# Patient Record
Sex: Female | Born: 1990 | Race: White | Hispanic: No | Marital: Single | Smoking: Never smoker
Health system: Southern US, Community
[De-identification: ages and names within clinical notes are randomized; demographics above are authoritative.]

---
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
## 2012-09-16 ENCOUNTER — Ambulatory Visit: Payer: Self-pay | Admitting: Obstetrics and Gynecology

## 2012-09-16 LAB — CBC
HCT: 35.8 % (ref 35.0–47.0)
MCH: 28.5 pg (ref 26.0–34.0)
MCV: 87 fL (ref 80–100)
Platelet: 168 10*3/uL (ref 150–440)
RBC: 4.14 10*6/uL (ref 3.80–5.20)
RDW: 13.8 % (ref 11.5–14.5)

## 2012-09-19 ENCOUNTER — Inpatient Hospital Stay: Payer: Self-pay | Admitting: Obstetrics and Gynecology

## 2014-11-12 DIAGNOSIS — H6121 Impacted cerumen, right ear: Secondary | ICD-10-CM | POA: Insufficient documentation

## 2015-04-09 NOTE — H&P (Signed)
PATIENT NAME:  Denice BorsBATTS, Chenell MR#:  [id number] DATE OF BIRTH:  26-Sep-1991  DATE OF ADMISSION:  09/19/2012  PREOPERATIVE DIAGNOSES:  1. 39.0 week intrauterine pregnancy, undelivered.  2. Severe anxiety and vaginismus.   HISTORY: The patient is a 24 year old year-old single white female gravida 1, para 0 with an EDC of 09/22/2012, EGA 39.4 weeks, who presents for primary low cervical transverse cesarean section secondary to term pregnancy complicated by severe anxiety and vaginismus. The patient opts for elective primary C-section. Her prenatal course has been uneventful except for nausea and vomiting in the first trimester as well as transient first trimester spotting which has resolved.   The patient reports good fetal movement. She denies preeclampsia symptoms.   PHYSICAL EXAMINATION:   VITAL SIGNS: Height 5 feet, weight 141, blood pressure 103/73, and heart rate 78.   GENERAL: The patient is a pleasant white female with significant anxiety. She is quiet in demeanor.   HEENT: Oropharynx is clear.   NECK: Supple without thyromegaly or adenopathy.   LUNGS: Clear.   HEART: Regular rate and rhythm without murmur.   ABDOMEN: Soft, gravid. Fundal height 33 cm. Estimated fetal weight 7 pounds. Fetal heart tones are normal.   PELVIC: Deferred due to patient's severe anxiety and vaginismus and noncompliant.   EXTREMITIES: Trace edema.   SKIN: No rash.   MUSCULOSKELETAL: Normal.   PRENATAL LABS: A-positive blood. Rh type antibody screen negative. RPR nonreactive. Rubella immune. Varicella immune. HBsAg negative. HIV negative. Cystic fibrosis negative. Toxo titer negative. GBS not done due to the patient's inability to tolerate exam. Pap notable for LGSIL.   IMPRESSION:  1. 39.4 week intrauterine pregnancy, undelivered.  2. Severe anxiety/vaginismus.   PLAN: Primary low cervical transverse cesarean section.   CONSENT NOTE: The patient is to undergo primary C-section. She is  understanding of the planned procedure and is aware and accepting of all surgical risks which include but are not limited to bleeding, infection, pelvic organ injury with need for repair, fetal injury, blood clot disorders, anesthesia risks, and death. All questions are answered. Informed consent is given. The patient is ready and willing to proceed with surgery as scheduled. ____________________________ Prentice DockerMartin A. Bellamarie Pflug, MD mad:slb D: 09/19/2012 16:01:00 ET T: 09/19/2012 16:21:02 ET JOB#: [id number]  cc: Daphine DeutscherMartin A. Alois Mincer, MD, <Dictator> Prentice DockerMARTIN A Joycelin Radloff MD ELECTRONICALLY SIGNED 09/22/2012 20:38

## 2015-04-09 NOTE — Op Note (Signed)
PATIENT NAME:  Taylor Wilson, Taylor Wilson MR#:  [id number] DATE OF BIRTH:  Jun 19, 1991  DATE OF PROCEDURE:  09/19/2012  PREOPERATIVE DIAGNOSES:  1. A 39.4 week intrauterine pregnancy, undelivered.  2. Extreme anxiety/vaginismus.   POSTOPERATIVE DIAGNOSES:  1. A 39.4 week intrauterine pregnancy, undelivered.  2. Extreme anxiety/vaginismus.  3. Viable female, 6 pounds and 14 ounces.   OPERATIVE PROCEDURE: Primary low segment transverse cesarean section.   SURGEON: Herold HarmsMartin A Jupiter Boys, Taylor Wilson   FIRST ASSISTANT: Yolanda BonineMelody Burr, CNM   ANESTHESIA: Spinal.   INDICATIONS: The patient is a 24 year old white female, gravida 1, para zero at 39.[redacted] weeks gestation, who presents for primary low cervical transverse cesarean section secondary to a personal history of extreme anxiety and vaginismus. The patient is not capable of tolerating labor. The prenatal course has been otherwise uneventful except for some early first trimester spotting and nausea and vomiting which have resolved.   FINDINGS AT SURGERY: A viable female infant, 6 pounds and 14 ounces, having Apgars of 2 and 9 at one minute and five minutes, respectively. Uterus, tubes, and ovaries were grossly normal. The placenta was grossly normal.   DESCRIPTION OF PROCEDURE: The patient was brought to the Operating Room where she was placed in sitting position. Spinal anesthetic was introduced without difficulty. She was placed in the supine position with a right lateral hip roll in place. A Foley catheter was placed and was draining clear yellow urine. A ChloraPrep abdominal and perineal prep and drape was performed in the standard fashion. After checking for adequate level of anesthesia, a Pfannenstiel incision was made in the abdomen. The fascia was incised transversely and extended bilaterally with Mayo's. The midline raphe was incised, separated. and the peritoneum was entered. A bladder flap was created through sharp dissection. A low transverse incision was made in  the uterus and this was extended bluntly bilaterally. The infant was delivered through a vertex presentation and the oropharynx and nasopharynx were cleared of debris. The nuchal cord was doubly clamped and cut, and the infant was passed to the awaiting resuscitation team. Cord blood typing was obtained. Cord gases were sent. The placenta was expressed from the uterus. The uterus was externalized onto  the anterior abdominal wall and cleared of all debris . The incision was closed in layers with #1 chromic suture. The first layer was a running locking stitch. The second layer was a imbricating layer. The uterus was placed back into the abdominopelvic cavity. Gutters were cleared of all debris with laps. The incision was then closed in layers with 0 Maxon being used in the fascia in a simple running manner. The skin was closed with staples. Pressure dressing was applied. The patient was then mobilized and taken to the recovery room in satisfactory condition.   ESTIMATED BLOOD LOSS: 500 mL.   COMPLICATIONS: None.   All instrument, needle, and sponge counts were verified as correct. The patient did receive clindamycin antibiotic prophylaxis.  ____________________________ Taylor DockerMartin A. Pink Maye, Taylor Wilson mad:cbb D: 09/19/2012 20:42:00 ET T: 09/20/2012 09:48:22 ET JOB#: [id number]  cc: Taylor DeutscherMartin A. Taygan Connell, Taylor Wilson, <Dictator> Taylor Wilson ELECTRONICALLY SIGNED 09/22/2012 21:50

## 2015-06-17 ENCOUNTER — Emergency Department: Payer: BLUE CROSS/BLUE SHIELD

## 2015-06-17 ENCOUNTER — Encounter: Payer: Self-pay | Admitting: *Deleted

## 2015-06-17 ENCOUNTER — Emergency Department
Admission: EM | Admit: 2015-06-17 | Discharge: 2015-06-17 | Disposition: A | Discharge status: Home / Self Care | Payer: BLUE CROSS/BLUE SHIELD | Attending: Emergency Medicine | Admitting: Emergency Medicine

## 2015-06-17 DIAGNOSIS — S0081XA Abrasion of other part of head, initial encounter: Secondary | ICD-10-CM | POA: Diagnosis not present

## 2015-06-17 DIAGNOSIS — Z880 Allergy status to penicillin: Secondary | ICD-10-CM | POA: Insufficient documentation

## 2015-06-17 DIAGNOSIS — S139XXA Sprain of joints and ligaments of unspecified parts of neck, initial encounter: Secondary | ICD-10-CM | POA: Diagnosis not present

## 2015-06-17 DIAGNOSIS — S40219A Abrasion of unspecified shoulder, initial encounter: Secondary | ICD-10-CM | POA: Diagnosis not present

## 2015-06-17 DIAGNOSIS — S0990XA Unspecified injury of head, initial encounter: Secondary | ICD-10-CM | POA: Diagnosis present

## 2015-06-17 DIAGNOSIS — Y998 Other external cause status: Secondary | ICD-10-CM | POA: Insufficient documentation

## 2015-06-17 DIAGNOSIS — Y92410 Unspecified street and highway as the place of occurrence of the external cause: Secondary | ICD-10-CM | POA: Diagnosis not present

## 2015-06-17 DIAGNOSIS — Y9389 Activity, other specified: Secondary | ICD-10-CM | POA: Insufficient documentation

## 2015-06-17 DIAGNOSIS — Z79899 Other long term (current) drug therapy: Secondary | ICD-10-CM | POA: Insufficient documentation

## 2015-06-17 DIAGNOSIS — T148XXA Other injury of unspecified body region, initial encounter: Secondary | ICD-10-CM

## 2015-06-17 LAB — CBC WITH DIFFERENTIAL/PLATELET
BASOS PCT: 0 %
Basophils Absolute: 0 10*3/uL (ref 0–0.1)
EOS PCT: 0 %
Eosinophils Absolute: 0 10*3/uL (ref 0–0.7)
HEMATOCRIT: 42.4 % (ref 35.0–47.0)
HEMOGLOBIN: 14 g/dL (ref 12.0–16.0)
Lymphocytes Relative: 8 %
Lymphs Abs: 1.4 10*3/uL (ref 1.0–3.6)
MCH: 29.6 pg (ref 26.0–34.0)
MCHC: 33 g/dL (ref 32.0–36.0)
MCV: 89.8 fL (ref 80.0–100.0)
MONO ABS: 0.8 10*3/uL (ref 0.2–0.9)
MONOS PCT: 5 %
NEUTROS ABS: 14.6 10*3/uL — AB (ref 1.4–6.5)
Neutrophils Relative %: 87 %
Platelets: 265 10*3/uL (ref 150–440)
RBC: 4.72 MIL/uL (ref 3.80–5.20)
RDW: 13.3 % (ref 11.5–14.5)
WBC: 16.8 10*3/uL — ABNORMAL HIGH (ref 3.6–11.0)

## 2015-06-17 LAB — COMPREHENSIVE METABOLIC PANEL
ALBUMIN: 4.6 g/dL (ref 3.5–5.0)
ALK PHOS: 32 U/L — AB (ref 38–126)
ALT: 18 U/L (ref 14–54)
ANION GAP: 10 (ref 5–15)
AST: 24 U/L (ref 15–41)
BUN: 15 mg/dL (ref 6–20)
CO2: 25 mmol/L (ref 22–32)
Calcium: 8.9 mg/dL (ref 8.9–10.3)
Chloride: 110 mmol/L (ref 101–111)
Creatinine, Ser: 0.81 mg/dL (ref 0.44–1.00)
GFR calc Af Amer: >60 mL/min (ref >60)
GFR calc non Af Amer: >60 mL/min (ref >60)
Glucose, Bld: 104 mg/dL — ABNORMAL HIGH (ref 65–99)
Potassium: 3.7 mmol/L (ref 3.5–5.1)
SODIUM: 145 mmol/L (ref 135–145)
TOTAL PROTEIN: 8 g/dL (ref 6.5–8.1)
Total Bilirubin: 0.3 mg/dL (ref 0.3–1.2)

## 2015-06-17 LAB — LIPASE, BLOOD: Lipase: 30 U/L (ref 22–51)

## 2015-06-17 MED ORDER — ONDANSETRON HCL 4 MG/2ML IJ SOLN
4.0000 mg | Freq: Once | INTRAMUSCULAR | Status: AC
  Administered 2015-06-17: 4 mg via INTRAVENOUS

## 2015-06-17 MED ORDER — MORPHINE SULFATE 2 MG/ML IJ SOLN
INTRAMUSCULAR | Status: AC
  Filled 2015-06-17: qty 1

## 2015-06-17 MED ORDER — DIPHENHYDRAMINE HCL 50 MG/ML IJ SOLN
12.5000 mg | Freq: Once | INTRAMUSCULAR | Status: AC
  Administered 2015-06-17: 12.5 mg via INTRAVENOUS

## 2015-06-17 MED ORDER — DIPHENHYDRAMINE HCL 50 MG/ML IJ SOLN
INTRAMUSCULAR | Status: AC
  Filled 2015-06-17: qty 1

## 2015-06-17 MED ORDER — IBUPROFEN 600 MG PO TABS: 600.0000 mg | ORAL_TABLET | Freq: Three times a day (TID) | ORAL | Status: DC | PRN

## 2015-06-17 MED ORDER — MORPHINE SULFATE 2 MG/ML IJ SOLN
2.0000 mg | Freq: Once | INTRAMUSCULAR | Status: AC
  Administered 2015-06-17: 2 mg via INTRAVENOUS

## 2015-06-17 MED ORDER — HYDROCODONE-ACETAMINOPHEN 5-325 MG PO TABS: 1.0000 | ORAL_TABLET | Freq: Four times a day (QID) | ORAL | Status: DC | PRN
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
## 2015-06-17 NOTE — ED Notes (Signed)
Pt declines offer for morphine and zofran.
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
## 2015-06-17 NOTE — ED Notes (Signed)
Pt now requesting morphine and zofran.
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
## 2017-08-25 ENCOUNTER — Encounter: Payer: Self-pay | Admitting: Obstetrics and Gynecology

## 2017-10-05 ENCOUNTER — Ambulatory Visit: Payer: Self-pay | Admitting: Obstetrics and Gynecology

## 2018-03-23 ENCOUNTER — Ambulatory Visit: Payer: BLUE CROSS/BLUE SHIELD | Admitting: Family Medicine

## 2018-03-23 DIAGNOSIS — Z0289 Encounter for other administrative examinations: Secondary | ICD-10-CM

## 2019-10-31 ENCOUNTER — Other Ambulatory Visit: Payer: Self-pay

## 2019-11-01 ENCOUNTER — Ambulatory Visit: Payer: BLUE CROSS/BLUE SHIELD | Admitting: Urology

## 2019-11-01 ENCOUNTER — Encounter: Payer: Self-pay | Admitting: Urology

## 2019-11-01 NOTE — Progress Notes (Deleted)
   11/01/2019 9:32 AM   Taylor Wilson [birth year] [id number]  Referring provider: Mariana Arn, MD [address] [place],  [place] [address]  No chief complaint on file.   HPI:    PMH: No past medical history on file.  Surgical History: Past Surgical History:  Procedure Laterality Date  . CESAREAN SECTION      Home Medications:  Allergies as of 11/01/2019      Reactions   Penicillins       Medication List       Accurate as of November 01, 2019  9:32 AM. If you have any questions, ask your nurse or doctor.        HYDROcodone-acetaminophen 5-325 MG tablet Commonly known as: Norco Take 1 tablet by mouth every 6 (six) hours as needed for moderate pain.   ibuprofen 600 MG tablet Commonly known as: ADVIL Take 1 tablet (600 mg total) by mouth every 8 (eight) hours as needed.   ISOtretinoin 40 MG capsule Commonly known as: ACCUTANE Take 40 mg by mouth 3 (three) times daily.       Allergies:  Allergies  Allergen Reactions  . Penicillins     Family History: No family history on file.  Social History:  reports that she has never smoked. She has never used smokeless tobacco. She reports current alcohol use. She reports that she does not use drugs.  ROS:                                        Physical Exam: There were no vitals taken for this visit.  Constitutional:  Alert and oriented, No acute distress. HEENT: [place] AT, moist mucus membranes.  Trachea midline, no masses. Cardiovascular: No clubbing, cyanosis, or edema. Respiratory: Normal respiratory effort, no increased work of breathing. GI: Abdomen is soft, nontender, nondistended, no abdominal masses GU: No CVA tenderness Lymph: No cervical or inguinal lymphadenopathy. Skin: No rashes, bruises or suspicious lesions. Neurologic: Grossly intact, no focal deficits, moving all 4 extremities. Psychiatric: Normal mood and affect.  Laboratory Data: Lab Results  Component Value  Date   WBC 16.8 (H) 06/17/2015   HGB 14.0 06/17/2015   HCT 42.4 06/17/2015   MCV 89.8 06/17/2015   PLT 265 06/17/2015    Lab Results  Component Value Date   CREATININE 0.81 06/17/2015    No results found for: PSA  No results found for: TESTOSTERONE  No results found for: HGBA1C  Urinalysis No results found for: COLORURINE, APPEARANCEUR, LABSPEC, PHURINE, GLUCOSEU, HGBUR, BILIRUBINUR, KETONESUR, PROTEINUR, UROBILINOGEN, NITRITE, LEUKOCYTESUR  No results found for: LABMICR, [place], RBCUA, LABEPIT, MUCUS, BACTERIA  Pertinent Imaging: *** No results found for this or any previous visit. No results found for this or any previous visit. No results found for this or any previous visit. No results found for this or any previous visit. No results found for this or any previous visit. No results found for this or any previous visit. No results found for this or any previous visit. No results found for this or any previous visit.  Assessment & Plan:    There are no diagnoses linked to this encounter.  No follow-ups on file.  Hollice Espy, MD  [facility] [address] [place], [place] [address] [phone]

## 2020-04-18 DIAGNOSIS — R768 Other specified abnormal immunological findings in serum: Secondary | ICD-10-CM | POA: Insufficient documentation

## 2020-04-25 DIAGNOSIS — J4520 Mild intermittent asthma, uncomplicated: Secondary | ICD-10-CM | POA: Insufficient documentation

## 2020-04-25 DIAGNOSIS — K219 Gastro-esophageal reflux disease without esophagitis: Secondary | ICD-10-CM | POA: Insufficient documentation

## 2020-04-25 DIAGNOSIS — F330 Major depressive disorder, recurrent, mild: Secondary | ICD-10-CM | POA: Insufficient documentation

## 2020-08-22 ENCOUNTER — Other Ambulatory Visit: Payer: Self-pay | Admitting: Internal Medicine

## 2020-08-22 DIAGNOSIS — R109 Unspecified abdominal pain: Secondary | ICD-10-CM

## 2020-08-22 DIAGNOSIS — R309 Painful micturition, unspecified: Secondary | ICD-10-CM

## 2020-09-27 ENCOUNTER — Other Ambulatory Visit: Payer: Self-pay

## 2020-09-27 ENCOUNTER — Emergency Department: Payer: BLUE CROSS/BLUE SHIELD

## 2020-09-27 DIAGNOSIS — Z5321 Procedure and treatment not carried out due to patient leaving prior to being seen by health care provider: Secondary | ICD-10-CM | POA: Insufficient documentation

## 2020-09-27 DIAGNOSIS — R0602 Shortness of breath: Secondary | ICD-10-CM | POA: Insufficient documentation

## 2020-09-27 DIAGNOSIS — R0600 Dyspnea, unspecified: Secondary | ICD-10-CM | POA: Diagnosis not present

## 2020-09-27 DIAGNOSIS — R079 Chest pain, unspecified: Secondary | ICD-10-CM | POA: Insufficient documentation

## 2020-09-27 DIAGNOSIS — R002 Palpitations: Secondary | ICD-10-CM | POA: Insufficient documentation

## 2020-09-27 LAB — BASIC METABOLIC PANEL
Anion gap: 10 (ref 5–15)
BUN: 14 mg/dL (ref 6–20)
CO2: 23 mmol/L (ref 22–32)
Calcium: 8.9 mg/dL (ref 8.9–10.3)
Chloride: 105 mmol/L (ref 98–111)
Creatinine, Ser: 0.8 mg/dL (ref 0.44–1.00)
GFR, Estimated: >60 mL/min (ref >60)
Glucose, Bld: 98 mg/dL (ref 70–99)
Potassium: 3.1 mmol/L — ABNORMAL LOW (ref 3.5–5.1)
Sodium: 138 mmol/L (ref 135–145)

## 2020-09-27 LAB — CBC
HCT: 38.9 % (ref 36.0–46.0)
Hemoglobin: 13.6 g/dL (ref 12.0–15.0)
MCH: 29.2 pg (ref 26.0–34.0)
MCHC: 35 g/dL (ref 30.0–36.0)
MCV: 83.5 fL (ref 80.0–100.0)
Platelets: 227 10*3/uL (ref 150–400)
RBC: 4.66 MIL/uL (ref 3.87–5.11)
RDW: 12.6 % (ref 11.5–15.5)
WBC: 12 10*3/uL — ABNORMAL HIGH (ref 4.0–10.5)
nRBC: 0 % (ref 0.0–0.2)

## 2020-09-27 LAB — TROPONIN I (HIGH SENSITIVITY): Troponin I (High Sensitivity): <2 ng/L (ref <18)

## 2020-09-27 NOTE — ED Triage Notes (Signed)
Pt states palpitations and chest tightness that began an hour ago. Pt with states she also feels like it is difficulty to take a deep breath. Pt appears in no acute distress.

## 2020-09-28 ENCOUNTER — Emergency Department
Admission: EM | Admit: 2020-09-28 | Discharge: 2020-09-28 | Disposition: A | Discharge status: Home / Self Care | Payer: BLUE CROSS/BLUE SHIELD | Attending: Emergency Medicine | Admitting: Emergency Medicine

## 2020-09-28 LAB — URINALYSIS, COMPLETE (UACMP) WITH MICROSCOPIC
Bacteria, UA: NONE SEEN
Bilirubin Urine: NEGATIVE
Glucose, UA: NEGATIVE mg/dL
Hgb urine dipstick: NEGATIVE
Ketones, ur: NEGATIVE mg/dL
Leukocytes,Ua: NEGATIVE
Nitrite: NEGATIVE
Protein, ur: NEGATIVE mg/dL
Specific Gravity, Urine: 1.021 (ref 1.005–1.030)
pH: 6 (ref 5.0–8.0)

## 2020-09-28 LAB — T4, FREE: Free T4: 0.95 ng/dL (ref 0.61–1.12)

## 2020-09-28 LAB — TROPONIN I (HIGH SENSITIVITY): Troponin I (High Sensitivity): 4 ng/L (ref <18)

## 2020-09-28 LAB — URINE DRUG SCREEN, QUALITATIVE (ARMC ONLY)
Amphetamines, Ur Screen: POSITIVE — AB
Barbiturates, Ur Screen: NOT DETECTED
Benzodiazepine, Ur Scrn: NOT DETECTED
Cannabinoid 50 Ng, Ur ~~LOC~~: NOT DETECTED
Cocaine Metabolite,Ur ~~LOC~~: NOT DETECTED
MDMA (Ecstasy)Ur Screen: NOT DETECTED
Methadone Scn, Ur: NOT DETECTED
Opiate, Ur Screen: NOT DETECTED
Phencyclidine (PCP) Ur S: NOT DETECTED
Tricyclic, Ur Screen: NOT DETECTED

## 2020-09-28 LAB — FIBRIN DERIVATIVES D-DIMER (ARMC ONLY): Fibrin derivatives D-dimer (ARMC): 196.73 ng/mL (FEU) (ref 0.00–499.00)

## 2020-09-28 LAB — TSH: TSH: 8.767 u[IU]/mL — ABNORMAL HIGH (ref 0.350–4.500)

## 2020-09-28 LAB — POCT PREGNANCY, URINE: Preg Test, Ur: NEGATIVE

## 2020-09-28 NOTE — ED Notes (Signed)
Pt up to front desk to inform that she is leaving. Pt reports felling better and advised by this RN to return if that changes and encouraged to follow up with PCP.

## 2021-04-03 IMAGING — CR DG CHEST 2V
1 series · 2 of 2 positions shown · non-contrast
Comparison: 06/17/2015

CLINICAL DATA: Dyspnea, palpitations

EXAM:
CHEST - 2 VIEW

[Series 1: dg chest 2 view · 0.14mm/px · 2 of 2 slices shown]
[im 1/2]
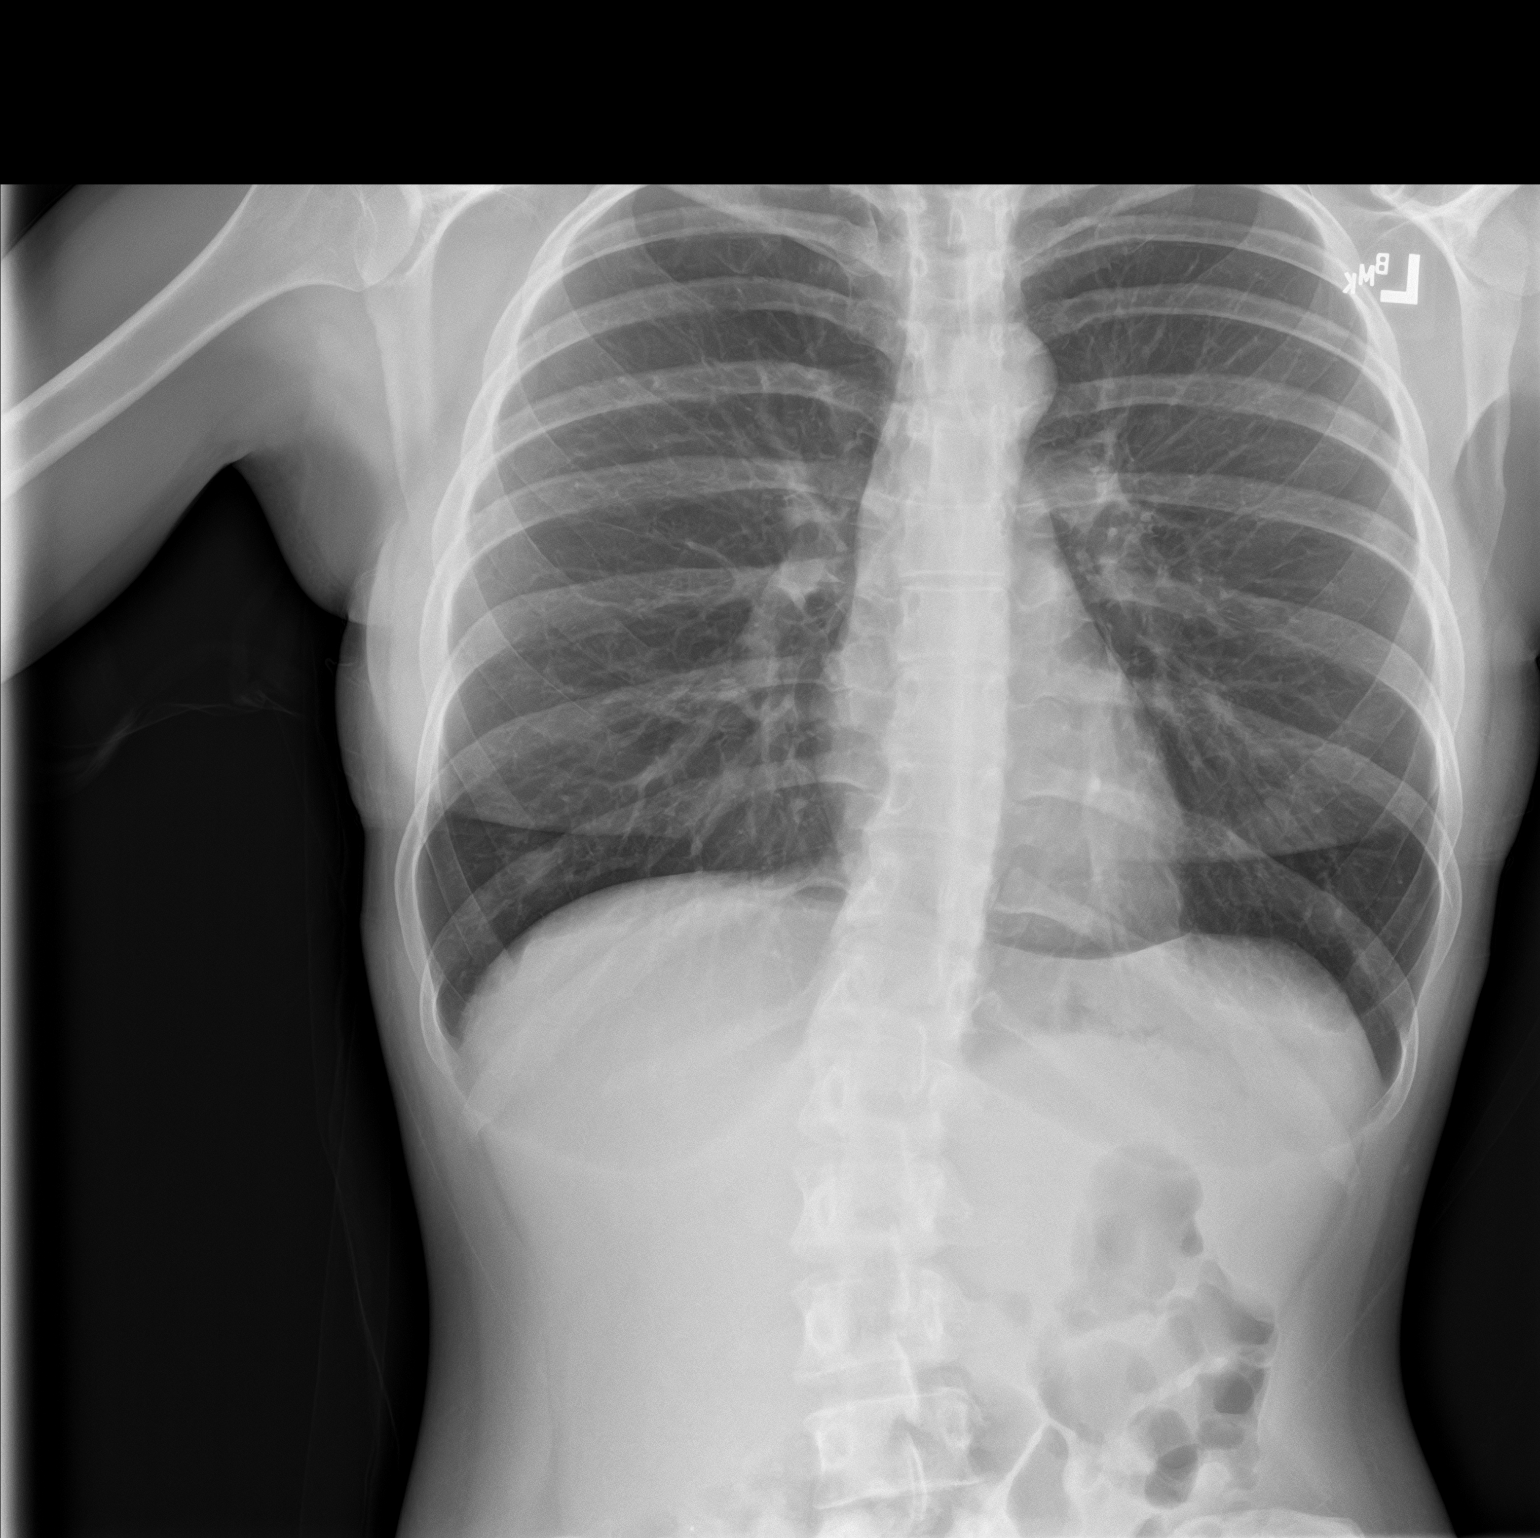
[im 2/2]
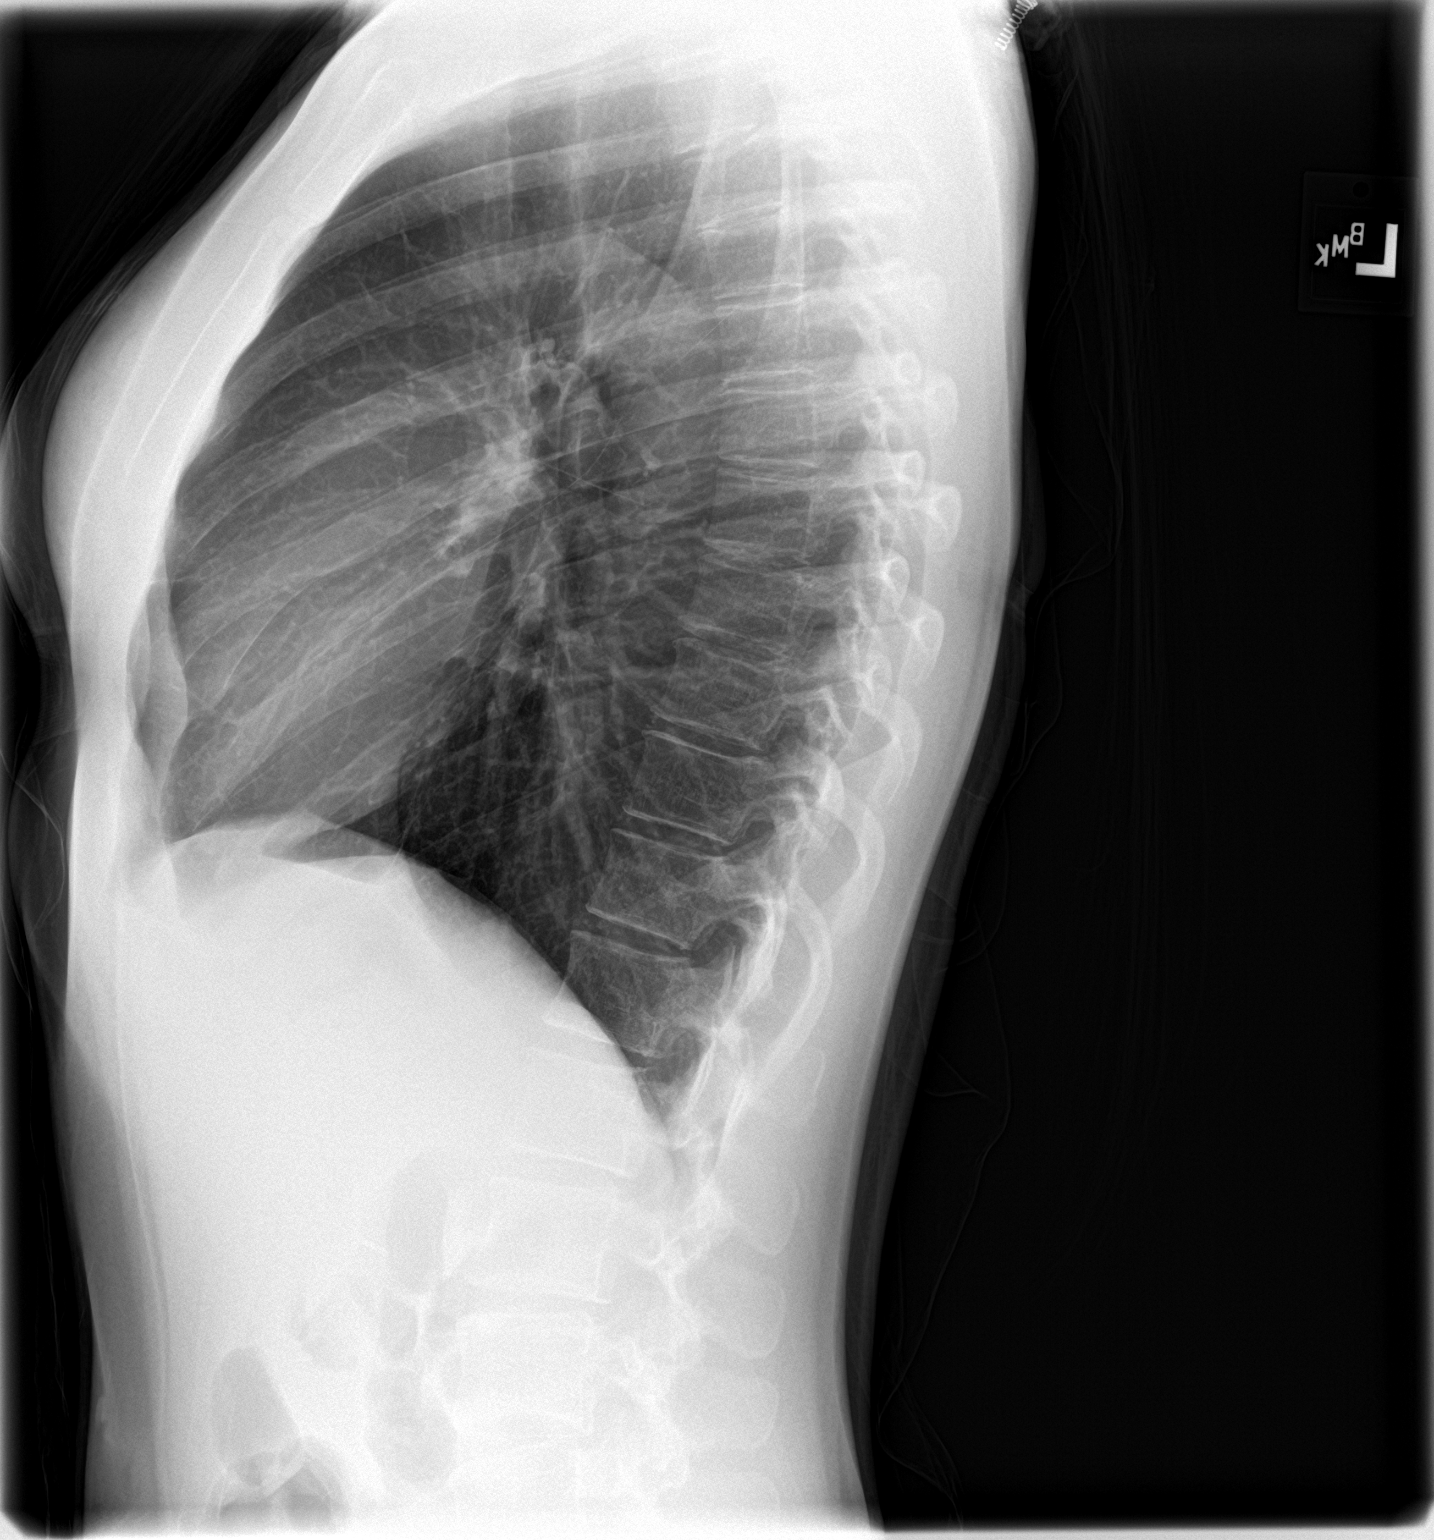

[2 of 2 positions shown; findings below may reference images not displayed]

FINDINGS: Lungs are well expanded, symmetric, and clear. No pneumothorax or
pleural effusion. Cardiac size within normal limits. Pulmonary
vascularity is normal. Osseous structures are age-appropriate. Mild
thoracolumbar scoliosis again noted. No acute bone abnormality.
IMPRESSION: No active cardiopulmonary disease.

## 2021-07-16 ENCOUNTER — Other Ambulatory Visit: Payer: Self-pay | Admitting: Infectious Diseases

## 2021-07-16 ENCOUNTER — Other Ambulatory Visit (HOSPITAL_COMMUNITY): Payer: Self-pay | Admitting: Infectious Diseases

## 2021-07-16 DIAGNOSIS — R634 Abnormal weight loss: Secondary | ICD-10-CM

## 2021-07-16 DIAGNOSIS — A319 Mycobacterial infection, unspecified: Secondary | ICD-10-CM

## 2021-07-16 DIAGNOSIS — J411 Mucopurulent chronic bronchitis: Secondary | ICD-10-CM

## 2021-08-01 ENCOUNTER — Ambulatory Visit: Payer: BLUE CROSS/BLUE SHIELD

## 2021-08-13 DIAGNOSIS — F909 Attention-deficit hyperactivity disorder, unspecified type: Secondary | ICD-10-CM | POA: Insufficient documentation

## 2021-08-13 DIAGNOSIS — F32A Depression, unspecified: Secondary | ICD-10-CM | POA: Insufficient documentation

## 2021-08-13 DIAGNOSIS — F419 Anxiety disorder, unspecified: Secondary | ICD-10-CM | POA: Insufficient documentation

## 2021-08-18 ENCOUNTER — Ambulatory Visit: Admission: RE | Admit: 2021-08-18 | Payer: BLUE CROSS/BLUE SHIELD | Source: Ambulatory Visit

## 2022-01-16 DIAGNOSIS — R634 Abnormal weight loss: Secondary | ICD-10-CM | POA: Diagnosis not present

## 2022-01-16 DIAGNOSIS — A319 Mycobacterial infection, unspecified: Secondary | ICD-10-CM | POA: Diagnosis not present

## 2022-01-16 DIAGNOSIS — J411 Mucopurulent chronic bronchitis: Secondary | ICD-10-CM | POA: Diagnosis not present

## 2022-01-26 DIAGNOSIS — R634 Abnormal weight loss: Secondary | ICD-10-CM | POA: Diagnosis not present

## 2022-01-26 DIAGNOSIS — A319 Mycobacterial infection, unspecified: Secondary | ICD-10-CM | POA: Diagnosis not present

## 2022-01-26 DIAGNOSIS — J411 Mucopurulent chronic bronchitis: Secondary | ICD-10-CM | POA: Diagnosis not present

## 2022-02-04 DIAGNOSIS — A319 Mycobacterial infection, unspecified: Secondary | ICD-10-CM | POA: Diagnosis not present

## 2022-02-04 DIAGNOSIS — J069 Acute upper respiratory infection, unspecified: Secondary | ICD-10-CM | POA: Diagnosis not present

## 2022-02-04 DIAGNOSIS — R058 Other specified cough: Secondary | ICD-10-CM | POA: Diagnosis not present

## 2022-02-04 DIAGNOSIS — J4520 Mild intermittent asthma, uncomplicated: Secondary | ICD-10-CM | POA: Diagnosis not present

## 2022-02-05 DIAGNOSIS — J4520 Mild intermittent asthma, uncomplicated: Secondary | ICD-10-CM | POA: Diagnosis not present

## 2022-02-05 DIAGNOSIS — J411 Mucopurulent chronic bronchitis: Secondary | ICD-10-CM | POA: Diagnosis not present

## 2022-02-05 DIAGNOSIS — J069 Acute upper respiratory infection, unspecified: Secondary | ICD-10-CM | POA: Diagnosis not present

## 2022-02-05 DIAGNOSIS — R634 Abnormal weight loss: Secondary | ICD-10-CM | POA: Diagnosis not present

## 2022-02-05 DIAGNOSIS — A319 Mycobacterial infection, unspecified: Secondary | ICD-10-CM | POA: Diagnosis not present

## 2022-02-05 DIAGNOSIS — R058 Other specified cough: Secondary | ICD-10-CM | POA: Diagnosis not present

## 2022-02-11 DIAGNOSIS — J310 Chronic rhinitis: Secondary | ICD-10-CM | POA: Diagnosis not present

## 2022-02-11 DIAGNOSIS — J342 Deviated nasal septum: Secondary | ICD-10-CM | POA: Diagnosis not present

## 2022-04-02 DIAGNOSIS — F413 Other mixed anxiety disorders: Secondary | ICD-10-CM | POA: Diagnosis not present

## 2022-04-02 DIAGNOSIS — F908 Attention-deficit hyperactivity disorder, other type: Secondary | ICD-10-CM | POA: Diagnosis not present

## 2022-04-02 DIAGNOSIS — R69 Illness, unspecified: Secondary | ICD-10-CM | POA: Diagnosis not present

## 2022-04-14 DIAGNOSIS — J4520 Mild intermittent asthma, uncomplicated: Secondary | ICD-10-CM | POA: Diagnosis not present

## 2022-04-14 DIAGNOSIS — M2550 Pain in unspecified joint: Secondary | ICD-10-CM | POA: Diagnosis not present

## 2022-04-14 DIAGNOSIS — R69 Illness, unspecified: Secondary | ICD-10-CM | POA: Diagnosis not present

## 2022-04-14 DIAGNOSIS — Z13220 Encounter for screening for lipoid disorders: Secondary | ICD-10-CM | POA: Diagnosis not present

## 2022-04-14 DIAGNOSIS — Z131 Encounter for screening for diabetes mellitus: Secondary | ICD-10-CM | POA: Diagnosis not present

## 2022-05-19 DIAGNOSIS — A319 Mycobacterial infection, unspecified: Secondary | ICD-10-CM | POA: Diagnosis not present

## 2022-05-19 DIAGNOSIS — J411 Mucopurulent chronic bronchitis: Secondary | ICD-10-CM | POA: Diagnosis not present

## 2022-05-19 DIAGNOSIS — K219 Gastro-esophageal reflux disease without esophagitis: Secondary | ICD-10-CM | POA: Diagnosis not present

## 2022-05-19 DIAGNOSIS — J329 Chronic sinusitis, unspecified: Secondary | ICD-10-CM | POA: Diagnosis not present

## 2022-06-03 DIAGNOSIS — A310 Pulmonary mycobacterial infection: Secondary | ICD-10-CM | POA: Diagnosis not present

## 2022-06-29 ENCOUNTER — Ambulatory Visit: Payer: 59 | Admitting: Urology

## 2022-06-29 ENCOUNTER — Encounter: Payer: Self-pay | Admitting: Urology

## 2022-06-29 VITALS — BP 155/100 | HR 76 | Ht 59.0 in | Wt 93.0 lb

## 2022-06-29 DIAGNOSIS — N3281 Overactive bladder: Secondary | ICD-10-CM | POA: Diagnosis not present

## 2022-06-29 DIAGNOSIS — R350 Frequency of micturition: Secondary | ICD-10-CM | POA: Diagnosis not present

## 2022-06-29 MED ORDER — OXYBUTYNIN CHLORIDE 5 MG PO TABS: 5.0000 mg | ORAL_TABLET | Freq: Two times a day (BID) | ORAL | 3 refills | Status: DC

## 2022-06-29 NOTE — Progress Notes (Signed)
06/29/2022 3:08 PM   Taylor Wilson [birth year] [id number]  Referring provider: Alm Bustard, NP [address] [place],  [place] [address]  Chief Complaint  Patient presents with   Over Active Bladder    New Patient    HPI: I was consulted to assess the patient's urgency and frequency.  She used to void frequency small amounts and double void a small amount.  She may have had some discomfort if she strained but no pain syndrome.  Has not had a hysterectomy.  Does not get bladder infections.  She is doing very well on 5 mg of oxybutynin twice a day and sometimes once a day  No kidney stones or bladder surgery.  No neurologic issues   PMH: No past medical history on file.  Surgical History: Past Surgical History:  Procedure Laterality Date   CESAREAN SECTION      Home Medications:  Allergies as of 06/29/2022       Reactions   Penicillins         Medication List        Accurate as of June 29, 2022  3:08 PM. If you have any questions, ask your nurse or doctor.          HYDROcodone-acetaminophen 5-325 MG tablet Commonly known as: Norco Take 1 tablet by mouth every 6 (six) hours as needed for moderate pain.   ibuprofen 600 MG tablet Commonly known as: ADVIL Take 1 tablet (600 mg total) by mouth every 8 (eight) hours as needed.   ISOtretinoin 40 MG capsule Commonly known as: ACCUTANE Take 40 mg by mouth 3 (three) times daily.        Allergies:  Allergies  Allergen Reactions   Penicillins     Family History: No family history on file.  Social History:  reports that she has never smoked. She has never used smokeless tobacco. She reports current alcohol use. She reports that she does not use drugs.  ROS:                                        Physical Exam: There were no vitals taken for this visit.    Laboratory Data: Lab Results  Component Value Date   WBC 12.0 (H) 09/27/2020   HGB 13.6 09/27/2020   HCT  38.9 09/27/2020   MCV 83.5 09/27/2020   PLT 227 09/27/2020    Lab Results  Component Value Date   CREATININE 0.80 09/27/2020    No results found for: "PSA"  No results found for: "TESTOSTERONE"  No results found for: "HGBA1C"  Urinalysis    Component Value Date/Time   COLORURINE YELLOW (A) 09/27/2020 2339   APPEARANCEUR HAZY (A) 09/27/2020 2339   LABSPEC 1.021 09/27/2020 2339   PHURINE 6.0 09/27/2020 2339   GLUCOSEU NEGATIVE 09/27/2020 2339   HGBUR NEGATIVE 09/27/2020 2339   BILIRUBINUR NEGATIVE 09/27/2020 2339   KETONESUR NEGATIVE 09/27/2020 2339   PROTEINUR NEGATIVE 09/27/2020 2339   NITRITE NEGATIVE 09/27/2020 2339   LEUKOCYTESUR NEGATIVE 09/27/2020 2339    Pertinent Imaging: Urine negative.  Chart reviewed.  Assessment & Plan: Patient has urgency and frequency well-controlled on oxybutynin.  She switched providers due to insurance.  Prescription renewed and I will see in a year and as needed.  74-month supply and 3 refills given  1. OAB (overactive bladder)  - Urinalysis, Complete   No follow-ups on file.  Martina Sinner, MD.   [facility] [address], [address] [place], [place] [address] [phone]

## 2022-06-30 LAB — URINALYSIS, COMPLETE
Bilirubin, UA: NEGATIVE
Glucose, UA: NEGATIVE
Ketones, UA: NEGATIVE
Leukocytes,UA: NEGATIVE
Nitrite, UA: NEGATIVE
Protein,UA: NEGATIVE
RBC, UA: NEGATIVE
Specific Gravity, UA: 1.015 (ref 1.005–1.030)
Urobilinogen, Ur: 0.2 mg/dL (ref 0.2–1.0)
pH, UA: 7 (ref 5.0–7.5)

## 2022-10-15 DIAGNOSIS — J4521 Mild intermittent asthma with (acute) exacerbation: Secondary | ICD-10-CM | POA: Diagnosis not present

## 2022-10-15 DIAGNOSIS — J329 Chronic sinusitis, unspecified: Secondary | ICD-10-CM | POA: Diagnosis not present

## 2022-10-15 DIAGNOSIS — R0981 Nasal congestion: Secondary | ICD-10-CM | POA: Diagnosis not present

## 2022-10-21 DIAGNOSIS — J988 Other specified respiratory disorders: Secondary | ICD-10-CM | POA: Diagnosis not present

## 2023-07-05 ENCOUNTER — Encounter: Payer: Self-pay | Admitting: Urology

## 2023-07-05 ENCOUNTER — Ambulatory Visit (INDEPENDENT_AMBULATORY_CARE_PROVIDER_SITE_OTHER): Payer: BLUE CROSS/BLUE SHIELD | Admitting: Urology

## 2023-07-05 VITALS — BP 141/90 | HR 93 | Ht 59.0 in | Wt 99.0 lb

## 2023-07-05 DIAGNOSIS — N3281 Overactive bladder: Secondary | ICD-10-CM

## 2023-07-05 LAB — URINALYSIS, COMPLETE
Bilirubin, UA: NEGATIVE
Glucose, UA: NEGATIVE
Ketones, UA: NEGATIVE
Leukocytes,UA: NEGATIVE
Nitrite, UA: NEGATIVE
RBC, UA: NEGATIVE
Specific Gravity, UA: 1.025 (ref 1.005–1.030)
Urobilinogen, Ur: 0.2 mg/dL (ref 0.2–1.0)
pH, UA: 6 (ref 5.0–7.5)

## 2023-07-05 LAB — MICROSCOPIC EXAMINATION: Epithelial Cells (non renal): >10 /hpf — AB (ref 0–10)

## 2023-07-05 MED ORDER — OXYBUTYNIN CHLORIDE 5 MG PO TABS
5.0000 mg | ORAL_TABLET | Freq: Two times a day (BID) | ORAL | 3 refills | Status: DC
Start: 2023-07-05 — End: 2024-06-26

## 2023-07-05 NOTE — Progress Notes (Signed)
07/05/2023 3:01 PM   Taylor Wilson 1991-05-05 [id number]  Referring provider: Alm Bustard, NP [address] [place],  [place] [address]  Chief Complaint  Patient presents with   Over Active Bladder    HPI: I was consulted to assess the patient's urgency and frequency. She used to void frequency small amounts and double void a small amount. She may have had some discomfort if she strained but no pain syndrome. Has not had a hysterectomy. Does not get bladder infections. She is doing very well on 5 mg of oxybutynin twice a day and sometimes once a day   reassess 1 year on oxybutynin  Today Very good control.  Sometimes takes 1 and other times half a tablet or twice a day.  No infections.  Frequency stable.   PMH: History reviewed. No pertinent past medical history.  Surgical History: Past Surgical History:  Procedure Laterality Date   CESAREAN SECTION      Home Medications:  Allergies as of 07/05/2023       Reactions   Penicillins         Medication List        Accurate as of July 05, 2023  3:01 PM. If you have any questions, ask your nurse or doctor.          albuterol 108 (90 Base) MCG/ACT inhaler Commonly known as: VENTOLIN HFA SMARTSIG:2 Puff(s) By Mouth PRN   amphetamine-dextroamphetamine 30 MG 24 hr capsule Commonly known as: ADDERALL XR Take 30 mg by mouth daily.   budesonide-formoterol 160-4.5 MCG/ACT inhaler Commonly known as: SYMBICORT SMARTSIG:2 inhalation Via Inhaler Twice Daily   fluticasone-salmeterol 115-21 MCG/ACT inhaler Commonly known as: ADVAIR HFA Inhale into the lungs.   hydrocortisone 2.5 % cream Apply topically 2 (two) times daily.   ibuprofen 600 MG tablet Commonly known as: ADVIL Take 1 tablet (600 mg total) by mouth every 8 (eight) hours as needed.   ISOtretinoin 40 MG capsule Commonly known as: ACCUTANE Take 40 mg by mouth 3 (three) times daily.   lamoTRIgine 100 MG tablet Commonly known as: LAMICTAL Take  100 mg by mouth daily.   oxybutynin 5 MG tablet Commonly known as: DITROPAN Take 1 tablet (5 mg total) by mouth 2 (two) times daily.   sertraline 100 MG tablet Commonly known as: ZOLOFT Take by mouth.   Trelegy Ellipta 200-62.5-25 MCG/ACT Aepb Generic drug: Fluticasone-Umeclidin-Vilant Inhale into the lungs.        Allergies:  Allergies  Allergen Reactions   Penicillins     Family History: History reviewed. No pertinent family history.  Social History:  reports that she has never smoked. She has never used smokeless tobacco. She reports current alcohol use. She reports that she does not use drugs.  ROS:                                        Physical Exam: BP (!) 141/90   Pulse 93   Ht 4\' 11"  (1.499 m)   Wt 44.9 kg   BMI 20.00 kg/m   Constitutional:  Alert and oriented, No acute distress.  Laboratory Data: Lab Results  Component Value Date   WBC 12.0 (H) 09/27/2020   HGB 13.6 09/27/2020   HCT 38.9 09/27/2020   MCV 83.5 09/27/2020   PLT 227 09/27/2020    Lab Results  Component Value Date   CREATININE 0.80 09/27/2020  No results found for: "PSA"  No results found for: "TESTOSTERONE"  No results found for: "HGBA1C"  Urinalysis    Component Value Date/Time   COLORURINE YELLOW (A) 09/27/2020 2339   APPEARANCEUR Clear 06/29/2022 1515   LABSPEC 1.021 09/27/2020 2339   PHURINE 6.0 09/27/2020 2339   GLUCOSEU Negative 06/29/2022 1515   HGBUR NEGATIVE 09/27/2020 2339   BILIRUBINUR Negative 06/29/2022 1515   KETONESUR NEGATIVE 09/27/2020 2339   PROTEINUR Negative 06/29/2022 1515   PROTEINUR NEGATIVE 09/27/2020 2339   NITRITE Negative 06/29/2022 1515   NITRITE NEGATIVE 09/27/2020 2339   LEUKOCYTESUR Negative 06/29/2022 1515   LEUKOCYTESUR NEGATIVE 09/27/2020 2339    Pertinent Imaging:   Assessment & Plan: Oxybutynin twice a day 36-month supply and 3 refills sent to pharmacy and I will see near  1. OAB (overactive  bladder)  - Urinalysis, Complete   No follow-ups on file.  Martina Sinner, MD  [facility] [address], [address] [place], [place] [address] [phone]

## 2024-06-26 ENCOUNTER — Ambulatory Visit (INDEPENDENT_AMBULATORY_CARE_PROVIDER_SITE_OTHER): Payer: Self-pay | Admitting: Urology

## 2024-06-26 VITALS — BP 144/95 | HR 81 | Ht 59.0 in | Wt 99.0 lb

## 2024-06-26 DIAGNOSIS — N3281 Overactive bladder: Secondary | ICD-10-CM

## 2024-06-26 LAB — URINALYSIS, COMPLETE
Bilirubin, UA: NEGATIVE
Glucose, UA: NEGATIVE
Ketones, UA: NEGATIVE
Leukocytes,UA: NEGATIVE
Nitrite, UA: NEGATIVE
RBC, UA: NEGATIVE
Specific Gravity, UA: 1.03 (ref 1.005–1.030)
Urobilinogen, Ur: 0.2 mg/dL (ref 0.2–1.0)
pH, UA: 6 (ref 5.0–7.5)

## 2024-06-26 LAB — MICROSCOPIC EXAMINATION: Epithelial Cells (non renal): >10 /HPF — AB (ref 0–10)

## 2024-06-26 MED ORDER — OXYBUTYNIN CHLORIDE 5 MG PO TABS: 5.0000 mg | ORAL_TABLET | Freq: Two times a day (BID) | ORAL | 3 refills | Status: AC

## 2024-06-26 NOTE — Progress Notes (Signed)
 06/26/2024 1:45 PM   Taylor Wilson 1991/07/23 [id number]  Referring provider: Harvey Gaetana CROME, NP [address] [place],  [place] [address]  Chief Complaint  Patient presents with   Follow-up   OAB (overactive bladder)    HPI: I was consulted to assess the patient's urgency and frequency. She used to void frequency small amounts and double void a small amount. She may have had some discomfort if she strained but no pain syndrome. Has not had a hysterectomy. Does not get bladder infections. She is doing very well on 5 mg of oxybutynin  twice a day and sometimes once a day   reassess 1 year on oxybutynin    Today Very good control.  Sometimes takes 1 and other times half a tablet or twice a day.  No infections.  Frequency stable.  Oxybutynin  twice a day 67-month supply and 3 refills sent to pharmacy and I will see one year  Today Patient continues to do very well.  Little to no urgency and urge incontinence.  Frequency improved.  She takes it once or twice a day.  No infection    PMH: No past medical history on file.  Surgical History: Past Surgical History:  Procedure Laterality Date   CESAREAN SECTION      Home Medications:  Allergies as of 06/26/2024       Reactions   Penicillins         Medication List        Accurate as of June 26, 2024  1:45 PM. If you have any questions, ask your nurse or doctor.          albuterol 108 (90 Base) MCG/ACT inhaler Commonly known as: VENTOLIN HFA SMARTSIG:2 Puff(s) By Mouth PRN   amphetamine-dextroamphetamine 30 MG 24 hr capsule Commonly known as: ADDERALL XR Take 30 mg by mouth daily.   budesonide-formoterol 160-4.5 MCG/ACT inhaler Commonly known as: SYMBICORT SMARTSIG:2 inhalation Via Inhaler Twice Daily   fluticasone-salmeterol 115-21 MCG/ACT inhaler Commonly known as: ADVAIR HFA Inhale into the lungs.   hydrocortisone 2.5 % cream Apply topically 2 (two) times daily.   ibuprofen  600 MG tablet Commonly  known as: ADVIL  Take 1 tablet (600 mg total) by mouth every 8 (eight) hours as needed.   ISOtretinoin 40 MG capsule Commonly known as: ACCUTANE Take 40 mg by mouth 3 (three) times daily.   lamoTRIgine 100 MG tablet Commonly known as: LAMICTAL Take 100 mg by mouth daily.   oxybutynin  5 MG tablet Commonly known as: DITROPAN  Take 1 tablet (5 mg total) by mouth 2 (two) times daily.   sertraline 100 MG tablet Commonly known as: ZOLOFT Take by mouth.   Trelegy Ellipta 200-62.5-25 MCG/ACT Aepb Generic drug: Fluticasone-Umeclidin-Vilant Inhale into the lungs.        Allergies:  Allergies  Allergen Reactions   Penicillins     Family History: No family history on file.  Social History:  reports that she has never smoked. She has never used smokeless tobacco. She reports current alcohol use. She reports that she does not use drugs.  ROS:                                        Physical Exam: There were no vitals taken for this visit.  Constitutional:  Alert and oriented, No acute distress. HEENT: [place] AT, moist mucus membranes.  Trachea midline, no masses.  Laboratory Data: Lab  Results  Component Value Date   WBC 12.0 (H) 09/27/2020   HGB 13.6 09/27/2020   HCT 38.9 09/27/2020   MCV 83.5 09/27/2020   PLT 227 09/27/2020    Lab Results  Component Value Date   CREATININE 0.80 09/27/2020    No results found for: PSA  No results found for: TESTOSTERONE  No results found for: HGBA1C  Urinalysis    Component Value Date/Time   COLORURINE YELLOW (A) 09/27/2020 2339   APPEARANCEUR Hazy (A) 07/05/2023 1500   LABSPEC 1.021 09/27/2020 2339   PHURINE 6.0 09/27/2020 2339   GLUCOSEU Negative 07/05/2023 1500   HGBUR NEGATIVE 09/27/2020 2339   BILIRUBINUR Negative 07/05/2023 1500   KETONESUR NEGATIVE 09/27/2020 2339   PROTEINUR Trace (A) 07/05/2023 1500   PROTEINUR NEGATIVE 09/27/2020 2339   NITRITE Negative 07/05/2023 1500   NITRITE  NEGATIVE 09/27/2020 2339   LEUKOCYTESUR Negative 07/05/2023 1500   LEUKOCYTESUR NEGATIVE 09/27/2020 2339    Pertinent Imaging:   Assessment & Plan: Twice a day oxybutynin  for 90 days and 3 refills sent to pharmacy and I will see in 1 year  1. OAB (overactive bladder) (Primary)    No follow-ups on file.  Taylor DELENA Elizabeth, MD  [facility] [address] [place], [place] [address] [phone]

## 2024-06-28 ENCOUNTER — Ambulatory Visit: Payer: Self-pay | Admitting: Urology

## 2025-06-25 ENCOUNTER — Ambulatory Visit: Admitting: Urology
